# Patient Record
Sex: Female | Born: 1960 | Race: White | Hispanic: No | Marital: Married | Smoking: Former smoker
Health system: Southern US, Community
[De-identification: ages and names within clinical notes are randomized; demographics above are authoritative.]

## PROBLEM LIST (undated history)

## (undated) DIAGNOSIS — M949 Disorder of cartilage, unspecified: Secondary | ICD-10-CM

## (undated) DIAGNOSIS — E785 Hyperlipidemia, unspecified: Secondary | ICD-10-CM

## (undated) DIAGNOSIS — N946 Dysmenorrhea, unspecified: Secondary | ICD-10-CM

## (undated) DIAGNOSIS — Z780 Asymptomatic menopausal state: Secondary | ICD-10-CM

## (undated) DIAGNOSIS — J301 Allergic rhinitis due to pollen: Secondary | ICD-10-CM

## (undated) DIAGNOSIS — Z87891 Personal history of nicotine dependence: Secondary | ICD-10-CM

## (undated) DIAGNOSIS — M899 Disorder of bone, unspecified: Secondary | ICD-10-CM

## (undated) DIAGNOSIS — I10 Essential (primary) hypertension: Secondary | ICD-10-CM

## (undated) DIAGNOSIS — Z8249 Family history of ischemic heart disease and other diseases of the circulatory system: Secondary | ICD-10-CM

## (undated) DIAGNOSIS — N390 Urinary tract infection, site not specified: Secondary | ICD-10-CM

## (undated) HISTORY — DX: Essential (primary) hypertension: I10

## (undated) HISTORY — DX: Hyperlipidemia, unspecified: E78.5

## (undated) HISTORY — DX: Disorder of cartilage, unspecified: M94.9

## (undated) HISTORY — DX: Asymptomatic menopausal state: Z78.0

## (undated) HISTORY — DX: Family history of ischemic heart disease and other diseases of the circulatory system: Z82.49

## (undated) HISTORY — DX: Allergic rhinitis due to pollen: J30.1

## (undated) HISTORY — DX: Personal history of nicotine dependence: Z87.891

## (undated) HISTORY — DX: Disorder of bone, unspecified: M89.9

## (undated) HISTORY — PX: BREAST ENHANCEMENT SURGERY: SHX7

## (undated) HISTORY — DX: Dysmenorrhea, unspecified: N94.6

## (undated) HISTORY — DX: Urinary tract infection, site not specified: N39.0

---

## 2003-04-12 ENCOUNTER — Other Ambulatory Visit: Admission: RE | Admit: 2003-04-12 | Discharge: 2003-04-12 | Payer: Self-pay | Admitting: Obstetrics and Gynecology

## 2003-04-15 ENCOUNTER — Ambulatory Visit (HOSPITAL_COMMUNITY): Admission: RE | Admit: 2003-04-15 | Discharge: 2003-04-15 | Payer: Self-pay | Admitting: Obstetrics and Gynecology

## 2003-04-15 ENCOUNTER — Encounter: Payer: Self-pay | Admitting: Obstetrics and Gynecology

## 2004-05-07 ENCOUNTER — Ambulatory Visit (HOSPITAL_COMMUNITY): Admission: RE | Admit: 2004-05-07 | Discharge: 2004-05-07 | Payer: Self-pay | Admitting: Obstetrics and Gynecology

## 2005-01-07 ENCOUNTER — Encounter: Admission: RE | Admit: 2005-01-07 | Discharge: 2005-01-07 | Payer: Self-pay | Admitting: Endocrinology

## 2005-05-10 ENCOUNTER — Ambulatory Visit (HOSPITAL_COMMUNITY): Admission: RE | Admit: 2005-05-10 | Discharge: 2005-05-10 | Payer: Self-pay | Admitting: Obstetrics and Gynecology

## 2005-06-04 ENCOUNTER — Encounter: Admission: RE | Admit: 2005-06-04 | Discharge: 2005-06-04 | Payer: Self-pay | Admitting: Obstetrics and Gynecology

## 2006-06-05 ENCOUNTER — Ambulatory Visit (HOSPITAL_COMMUNITY): Admission: RE | Admit: 2006-06-05 | Discharge: 2006-06-05 | Payer: Self-pay | Admitting: Obstetrics and Gynecology

## 2006-11-08 IMAGING — US US SOFT TISSUE HEAD/NECK
1 series · 14 of 25 positions shown · non-contrast
Comparison: none

CLINICAL DATA: Neck swelling.
 THYROID ULTRASOUND:
 The right and left lobes of the thyroid are normal in size and contour with right lobe measuring 2.9 x 0.7 x 1.0 cm in size and the left lobe measuring 3.0 x 0.9 x 0.8 cm in size.  The gland is diffusely homogeneously without evidence for focal mass or cyst.

[Series 1: unknown · 0.07mm/px · 14 of 33 slices shown]
[im 1/33]
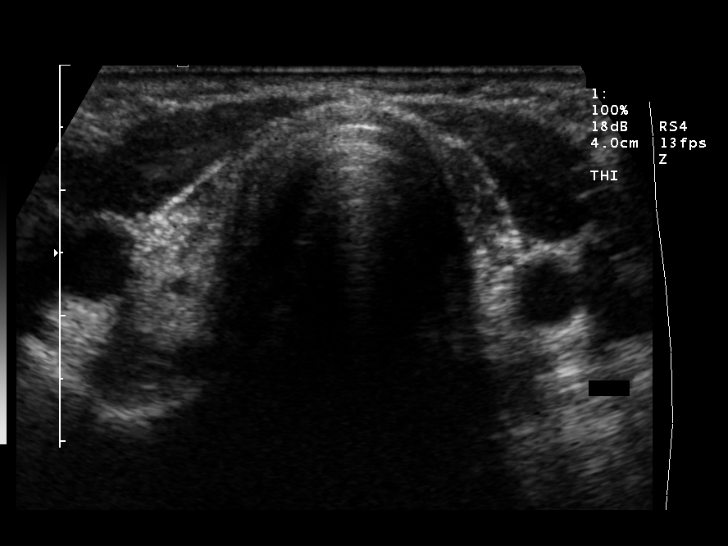
[im 3/33]
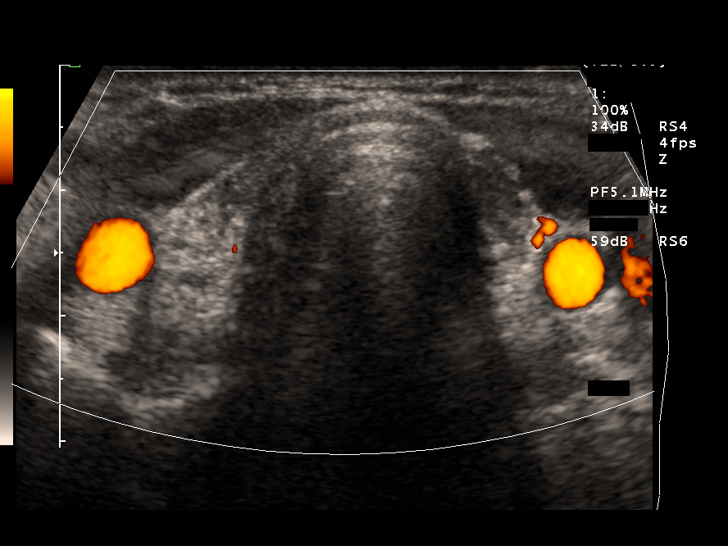
[im 6/33]
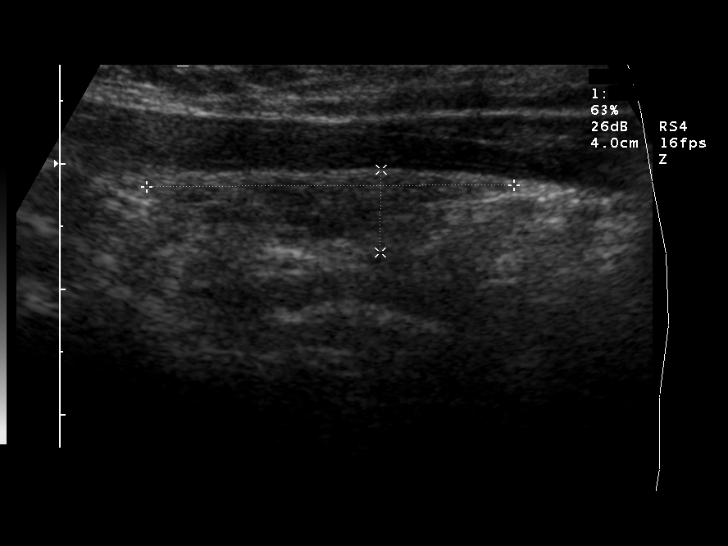
[im 9/33]
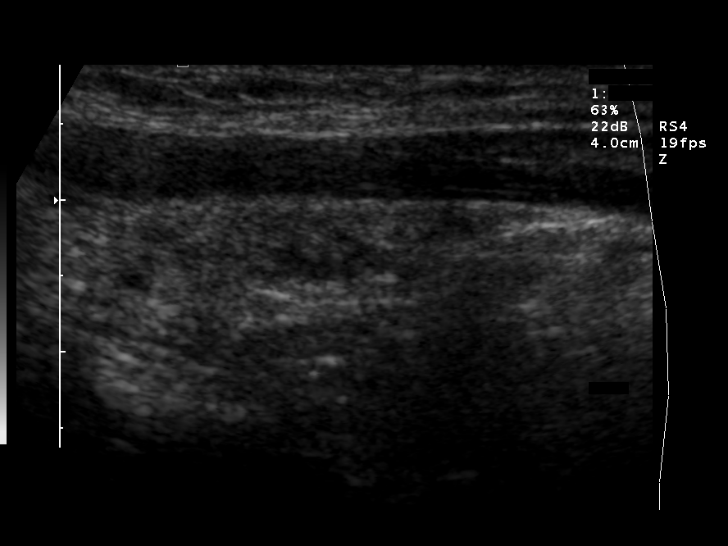
[im 11/33]
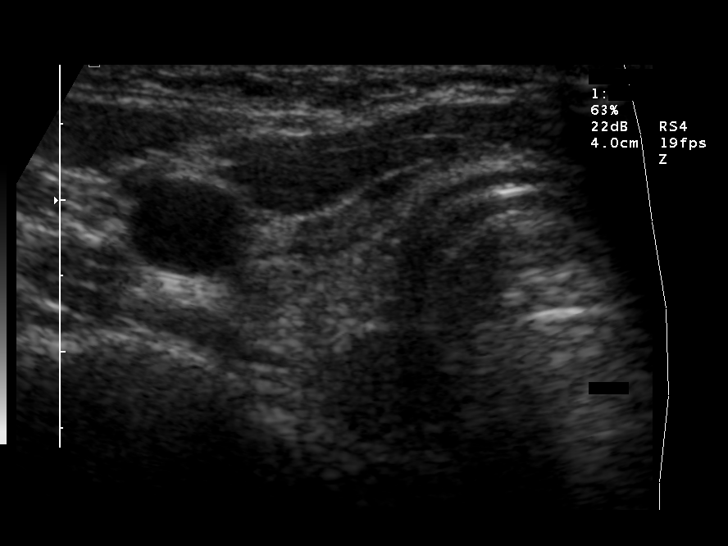
[im 13/33]
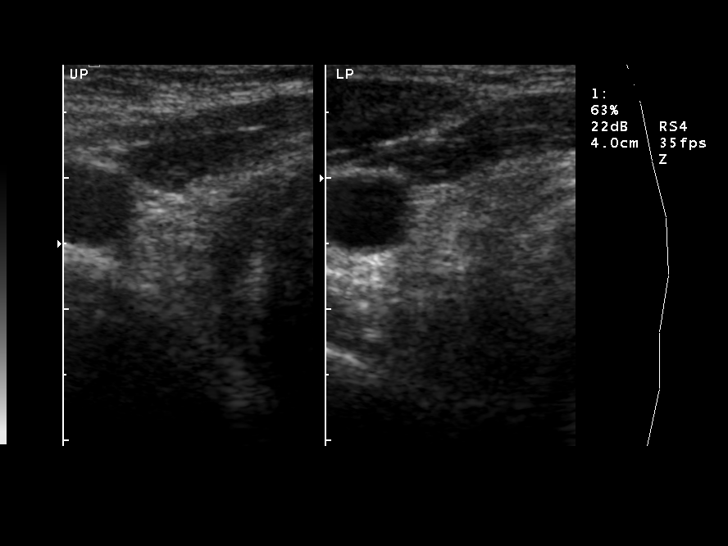
[im 15/33]
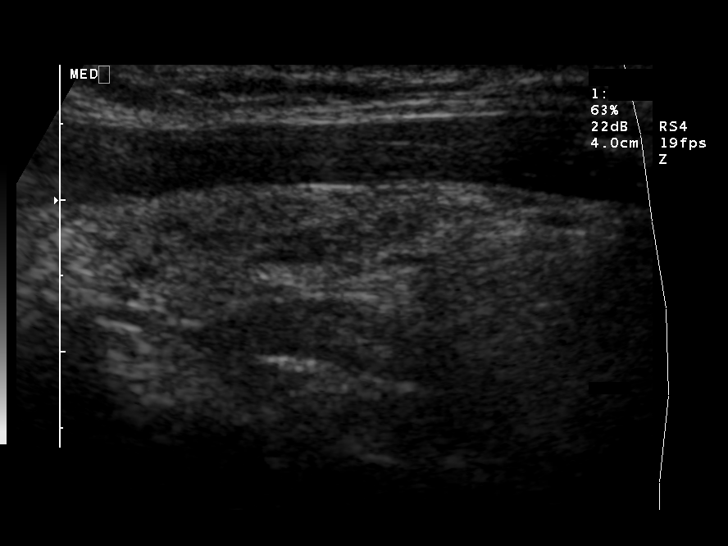
[im 18/33]
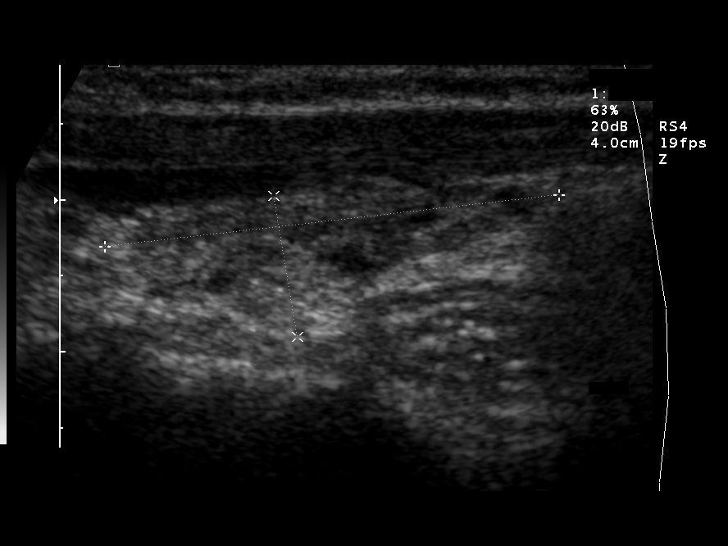
[im 21/33]
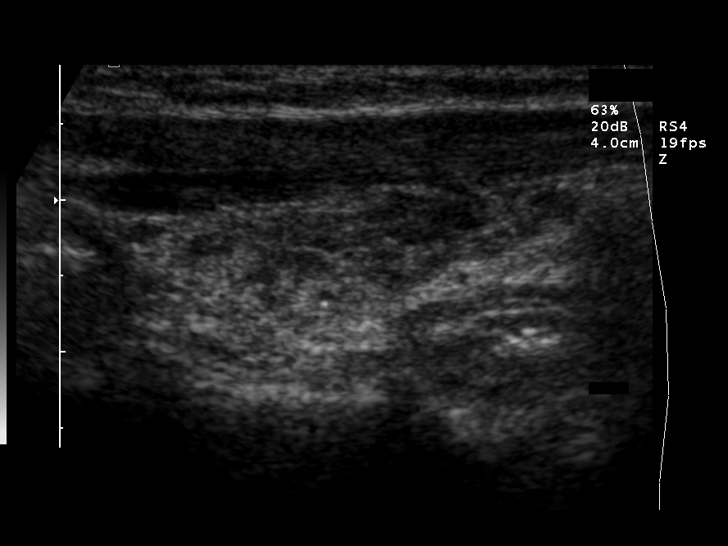
[im 22/33]
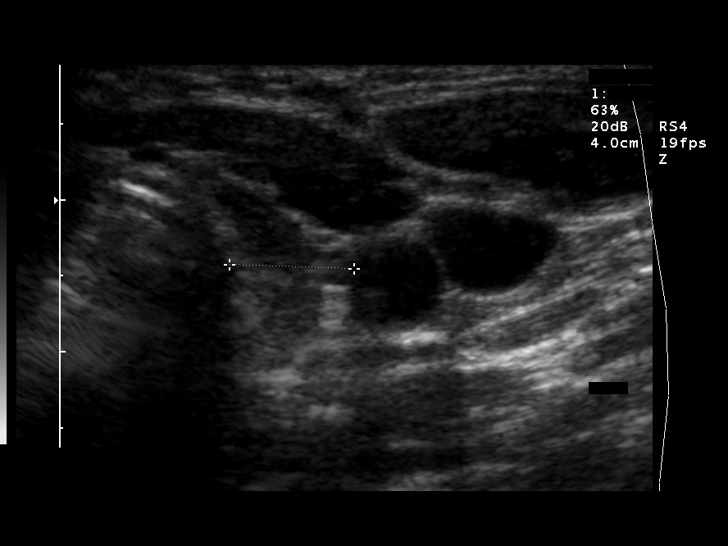
[im 25/33]
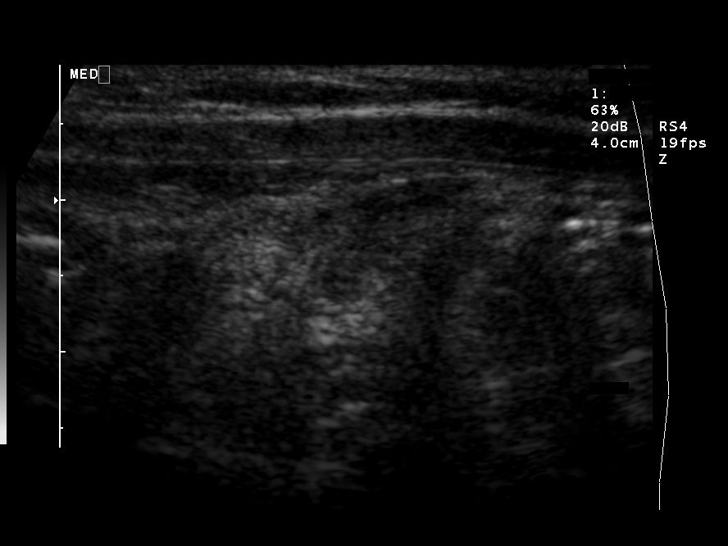
[im 27/33]
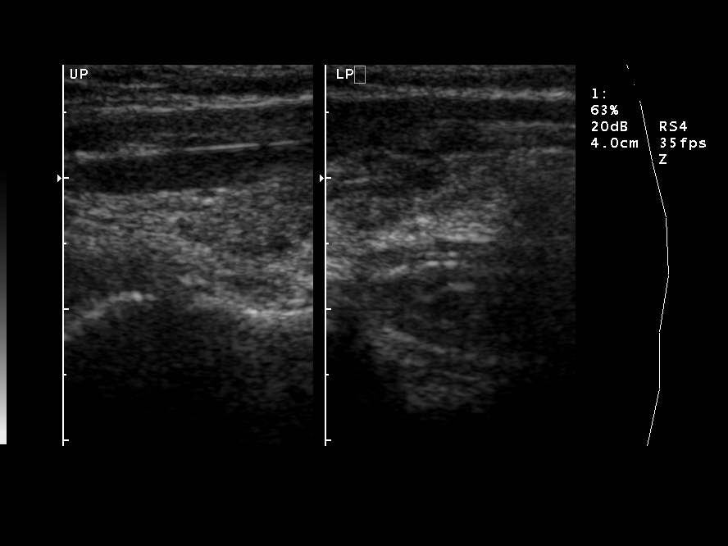
[im 30/33]
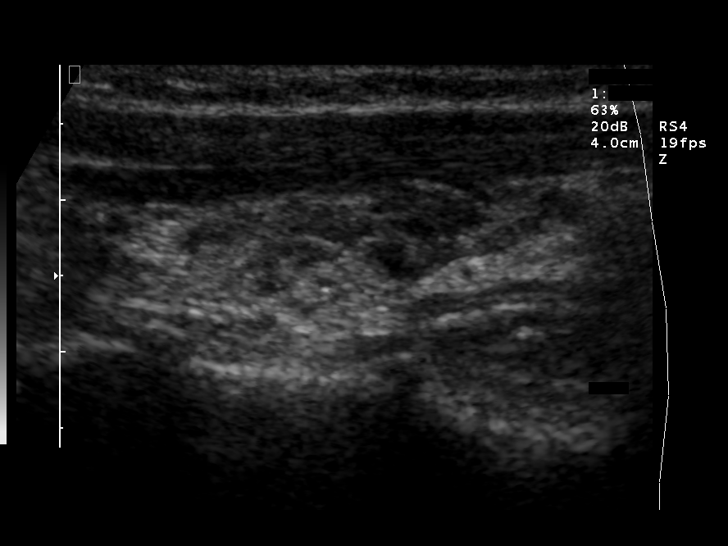
[im 33/33]
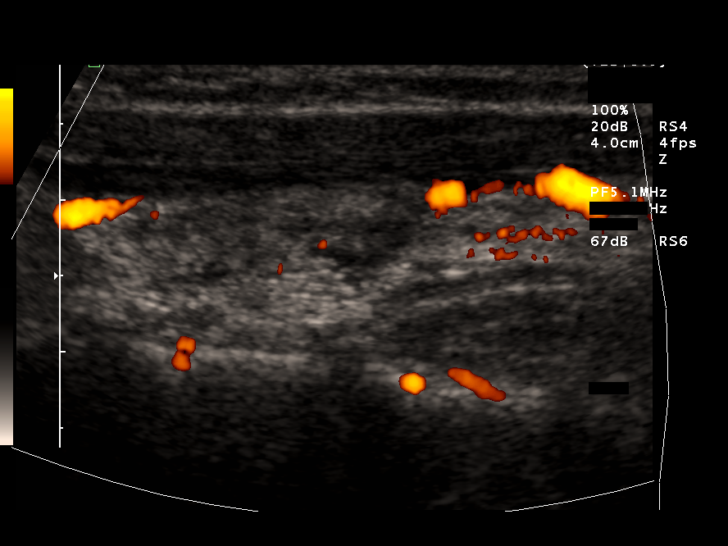

[14 of 25 positions shown; findings below may reference images not displayed]

IMPRESSION: Inhomogeneous gland without focal abnormality.

## 2007-06-17 ENCOUNTER — Ambulatory Visit (HOSPITAL_COMMUNITY): Admission: RE | Admit: 2007-06-17 | Discharge: 2007-06-17 | Payer: Self-pay | Admitting: Obstetrics and Gynecology

## 2008-06-29 ENCOUNTER — Ambulatory Visit (HOSPITAL_COMMUNITY): Admission: RE | Admit: 2008-06-29 | Discharge: 2008-06-29 | Payer: Self-pay | Admitting: Obstetrics and Gynecology

## 2009-07-05 ENCOUNTER — Ambulatory Visit (HOSPITAL_COMMUNITY): Admission: RE | Admit: 2009-07-05 | Discharge: 2009-07-05 | Payer: Self-pay | Admitting: Obstetrics and Gynecology

## 2010-07-14 ENCOUNTER — Encounter: Payer: Self-pay | Admitting: Obstetrics and Gynecology

## 2010-07-17 ENCOUNTER — Encounter
Admission: RE | Admit: 2010-07-17 | Discharge: 2010-07-17 | Payer: Self-pay | Source: Home / Self Care | Attending: Obstetrics and Gynecology | Admitting: Obstetrics and Gynecology

## 2011-07-17 ENCOUNTER — Other Ambulatory Visit: Payer: Self-pay | Admitting: Obstetrics and Gynecology

## 2011-07-17 DIAGNOSIS — Z1231 Encounter for screening mammogram for malignant neoplasm of breast: Secondary | ICD-10-CM

## 2011-08-06 ENCOUNTER — Ambulatory Visit
Admission: RE | Admit: 2011-08-06 | Discharge: 2011-08-06 | Disposition: A | Discharge status: Home / Self Care | Payer: BC Managed Care – PPO | Source: Ambulatory Visit | Attending: Obstetrics and Gynecology | Admitting: Obstetrics and Gynecology

## 2011-08-06 DIAGNOSIS — Z1231 Encounter for screening mammogram for malignant neoplasm of breast: Secondary | ICD-10-CM

## 2011-08-13 ENCOUNTER — Other Ambulatory Visit: Payer: Self-pay | Admitting: Obstetrics and Gynecology

## 2011-08-13 DIAGNOSIS — R928 Other abnormal and inconclusive findings on diagnostic imaging of breast: Secondary | ICD-10-CM

## 2011-08-21 ENCOUNTER — Ambulatory Visit
Admission: RE | Admit: 2011-08-21 | Discharge: 2011-08-21 | Disposition: A | Discharge status: Home / Self Care | Payer: BC Managed Care – PPO | Source: Ambulatory Visit | Attending: Obstetrics and Gynecology | Admitting: Obstetrics and Gynecology

## 2011-08-21 DIAGNOSIS — R928 Other abnormal and inconclusive findings on diagnostic imaging of breast: Secondary | ICD-10-CM

## 2012-08-04 ENCOUNTER — Other Ambulatory Visit: Payer: Self-pay | Admitting: Obstetrics and Gynecology

## 2012-08-04 DIAGNOSIS — Z1231 Encounter for screening mammogram for malignant neoplasm of breast: Secondary | ICD-10-CM

## 2012-09-04 ENCOUNTER — Ambulatory Visit
Admission: RE | Admit: 2012-09-04 | Discharge: 2012-09-04 | Disposition: A | Discharge status: Home / Self Care | Payer: BC Managed Care – PPO | Source: Ambulatory Visit | Attending: Obstetrics and Gynecology | Admitting: Obstetrics and Gynecology

## 2012-09-04 DIAGNOSIS — Z1231 Encounter for screening mammogram for malignant neoplasm of breast: Secondary | ICD-10-CM

## 2013-08-23 ENCOUNTER — Other Ambulatory Visit: Payer: Self-pay

## 2013-08-23 DIAGNOSIS — Z1231 Encounter for screening mammogram for malignant neoplasm of breast: Secondary | ICD-10-CM

## 2013-09-07 ENCOUNTER — Ambulatory Visit
Admission: RE | Admit: 2013-09-07 | Discharge: 2013-09-07 | Disposition: A | Discharge status: Home / Self Care | Payer: BC Managed Care – PPO | Source: Ambulatory Visit

## 2013-09-07 DIAGNOSIS — Z1231 Encounter for screening mammogram for malignant neoplasm of breast: Secondary | ICD-10-CM

## 2015-09-25 ENCOUNTER — Other Ambulatory Visit: Payer: Self-pay | Admitting: Certified Nurse Midwife

## 2015-09-25 DIAGNOSIS — M899 Disorder of bone, unspecified: Secondary | ICD-10-CM

## 2015-09-25 DIAGNOSIS — M949 Disorder of cartilage, unspecified: Principal | ICD-10-CM

## 2015-10-01 DIAGNOSIS — J069 Acute upper respiratory infection, unspecified: Secondary | ICD-10-CM | POA: Diagnosis not present

## 2015-10-12 ENCOUNTER — Ambulatory Visit
Admission: RE | Admit: 2015-10-12 | Discharge: 2015-10-12 | Disposition: A | Discharge status: Home / Self Care | Payer: BLUE CROSS/BLUE SHIELD | Source: Ambulatory Visit | Attending: Certified Nurse Midwife | Admitting: Certified Nurse Midwife

## 2015-10-12 DIAGNOSIS — Z780 Asymptomatic menopausal state: Secondary | ICD-10-CM | POA: Diagnosis not present

## 2015-10-12 DIAGNOSIS — M899 Disorder of bone, unspecified: Secondary | ICD-10-CM

## 2015-10-12 DIAGNOSIS — M810 Age-related osteoporosis without current pathological fracture: Secondary | ICD-10-CM | POA: Diagnosis not present

## 2015-10-12 DIAGNOSIS — M949 Disorder of cartilage, unspecified: Principal | ICD-10-CM

## 2015-11-14 ENCOUNTER — Encounter: Payer: Self-pay | Admitting: Cardiology

## 2015-11-17 ENCOUNTER — Ambulatory Visit (INDEPENDENT_AMBULATORY_CARE_PROVIDER_SITE_OTHER)
Admission: RE | Admit: 2015-11-17 | Discharge: 2015-11-17 | Disposition: A | Discharge status: Home / Self Care | Payer: Self-pay | Source: Ambulatory Visit | Attending: Cardiology | Admitting: Cardiology

## 2015-11-17 ENCOUNTER — Ambulatory Visit (INDEPENDENT_AMBULATORY_CARE_PROVIDER_SITE_OTHER): Payer: BLUE CROSS/BLUE SHIELD | Admitting: Cardiology

## 2015-11-17 ENCOUNTER — Encounter: Payer: Self-pay | Admitting: Cardiology

## 2015-11-17 VITALS — BP 116/84 | HR 69 | Ht 62.0 in | Wt 135.4 lb

## 2015-11-17 DIAGNOSIS — Z87891 Personal history of nicotine dependence: Secondary | ICD-10-CM | POA: Insufficient documentation

## 2015-11-17 DIAGNOSIS — I10 Essential (primary) hypertension: Secondary | ICD-10-CM | POA: Diagnosis not present

## 2015-11-17 DIAGNOSIS — Z780 Asymptomatic menopausal state: Secondary | ICD-10-CM | POA: Insufficient documentation

## 2015-11-17 DIAGNOSIS — Z8249 Family history of ischemic heart disease and other diseases of the circulatory system: Secondary | ICD-10-CM | POA: Diagnosis not present

## 2015-11-17 DIAGNOSIS — E785 Hyperlipidemia, unspecified: Secondary | ICD-10-CM | POA: Insufficient documentation

## 2015-11-17 NOTE — Patient Instructions (Addendum)
Medication Instructions:  Your physician recommends that you continue on your current medications as directed. Please refer to the Current Medication list given to you today.   Labwork: Your physician recommends that you return for FASTING lab work in: 4 MONTHS.   Testing/Procedures: Your physician has requested that you have an exercise tolerance test. For further information please visit [email or web address] Please also follow instruction sheet, as given.   Dr. Mayford Knife recommends you have a coronary calcium score.  Follow-Up: Your physician wants you to follow-up in: 1 year with Dr. Mayford Knife. You will receive a reminder letter in the mail two months in advance. If you don't receive a letter, please call our office to schedule the follow-up appointment.   Any Other Special Instructions Will Be Listed Below (If Applicable).     If you need a refill on your cardiac medications before your next appointment, please call your pharmacy.  Fat and Cholesterol Restricted Diet High levels of fat and cholesterol in your blood may lead to various health problems, such as diseases of the heart, blood vessels, gallbladder, liver, and pancreas. Fats are concentrated sources of energy that come in various forms. Certain types of fat, including saturated fat, may be harmful in excess. Cholesterol is a substance needed by your body in small amounts. Your body makes all the cholesterol it needs. Excess cholesterol comes from the food you eat. When you have high levels of cholesterol and saturated fat in your blood, health problems can develop because the excess fat and cholesterol will gather along the walls of your blood vessels, causing them to narrow. Choosing the right foods will help you control your intake of fat and cholesterol. This will help keep the levels of these substances in your blood within normal limits and reduce your risk of disease.  WHAT TYPES OF FAT SHOULD I CHOOSE?  Choose healthy fats  more often. Choose monounsaturated and polyunsaturated fats, such as olive and canola oil, flaxseeds, walnuts, almonds, and seeds.  Eat more omega-3 fats. Good choices include salmon, mackerel, sardines, tuna, flaxseed oil, and ground flaxseeds. Aim to eat fish at least two times a week.  Limit saturated fats. Saturated fats are primarily found in animal products, such as meats, butter, and cream. Plant sources of saturated fats include palm oil, palm kernel oil, and coconut oil.  Avoid foods with partially hydrogenated oils in them. These contain trans fats. Examples of foods that contain trans fats are stick margarine, some tub margarines, cookies, crackers, and other baked goods. WHAT GENERAL GUIDELINES DO I NEED TO FOLLOW? These guidelines for healthy eating will help you control your intake of fat and cholesterol:  Check food labels carefully to identify foods with trans fats or high amounts of saturated fat.  Fill one half of your plate with vegetables and green salads.  Fill one fourth of your plate with whole grains. Look for the word "whole" as the first word in the ingredient list.  Fill one fourth of your plate with lean protein foods.  Limit fruit to two servings a day. Choose fruit instead of juice.  Eat more foods that contain soluble fiber. Examples of foods that contain this type of fiber are apples, broccoli, carrots, beans, peas, and barley. Aim to get 20-30 g of fiber per day.  Eat more home-cooked food and less restaurant, buffet, and fast food.  Limit or avoid alcohol.  Limit foods high in starch and sugar.  Limit fried foods.  Cook foods using methods other than  frying. Baking, boiling, grilling, and broiling are all great options.  Lose weight if you are overweight. Losing just 5-10% of your initial body weight can help your overall health and prevent diseases such as diabetes and heart disease. WHAT FOODS CAN I EAT? Grains Whole grains, such as whole wheat  or whole grain breads, crackers, cereals, and pasta. Unsweetened oatmeal, bulgur, barley, quinoa, or brown rice. Corn or whole wheat flour tortillas. Vegetables Fresh or frozen vegetables (raw, steamed, roasted, or grilled). Green salads. Fruits All fresh, canned (in natural juice), or frozen fruits. Meat and Other Protein Products Ground beef (85% or leaner), grass-fed beef, or beef trimmed of fat. Skinless chicken or [place]. Ground chicken or [place]. Pork trimmed of fat. All fish and seafood. Eggs. Dried beans, peas, or lentils. Unsalted nuts or seeds. Unsalted canned or dry beans. Dairy Low-fat dairy products, such as skim or 1% milk, 2% or reduced-fat cheeses, low-fat ricotta or cottage cheese, or plain low-fat yogurt. Fats and Oils Tub margarines without trans fats. Light or reduced-fat mayonnaise and salad dressings. Avocado. Olive, canola, sesame, or safflower oils. Natural peanut or almond butter (choose ones without added sugar and oil). The items listed above may not be a complete list of recommended foods or beverages. Contact your dietitian for more options. WHAT FOODS ARE NOT RECOMMENDED? Grains White bread. White pasta. White rice. Cornbread. Bagels, pastries, and croissants. Crackers that contain trans fat. Vegetables White potatoes. Corn. Creamed or fried vegetables. Vegetables in a cheese sauce. Fruits Dried fruits. Canned fruit in light or heavy syrup. Fruit juice. Meat and Other Protein Products Fatty cuts of meat. Ribs, chicken wings, bacon, sausage, bologna, salami, chitterlings, fatback, hot dogs, bratwurst, and packaged luncheon meats. Liver and organ meats. Dairy Whole or 2% milk, cream, half-and-half, and cream cheese. Whole milk cheeses. Whole-fat or sweetened yogurt. Full-fat cheeses. Nondairy creamers and whipped toppings. Processed cheese, cheese spreads, or cheese curds. Sweets and Desserts Corn syrup, sugars, honey, and molasses. Candy. Jam and jelly. Syrup.  Sweetened cereals. Cookies, pies, cakes, donuts, muffins, and ice cream. Fats and Oils Butter, stick margarine, lard, shortening, ghee, or bacon fat. Coconut, palm kernel, or palm oils. Beverages Alcohol. Sweetened drinks (such as sodas, lemonade, and fruit drinks or punches). The items listed above may not be a complete list of foods and beverages to avoid. Contact your dietitian for more information.   This information is not intended to replace advice given to you by your health care provider. Make sure you discuss any questions you have with your health care provider.   Document Released: 06/10/2005 Document Revised: 07/01/2014 Document Reviewed: 09/08/2013 Elsevier Interactive Patient Education [employer]2016 Elsevier Inc.

## 2015-11-17 NOTE — Progress Notes (Signed)
Cardiology Office Note    Date:  11/17/2015   ID:  Charlotte Peters, DOB February 10, 1961, MRN [id number]  PCP:  Ethel Rana  Cardiologist:  Armanda Magic, MD   Chief Complaint  Patient presents with  . Hyperlipidemia    History of Present Illness:  Charlotte Peters is a 55 y.o. female with a history of dyslipidemia, hypothyroidism and HTN.  She was seen recently by her GYN for evaluation of hyperlipidemia.  She is post menopausal.  She is a former smoker.  She has a family history of heart disease in her mother, father and grandfather.  Her most recent TSH was normal at 2.8.  HbA1C was 5.7%.  Her lipids showed a total cholesterol of 268, triglycerides 248, HDL 67 and LDL 151.  She denies any chest pain, SOB, DOE, palpitations, dizziness, LE edema or syncope.      Past Medical History  Diagnosis Date  . Menopause   . Urinary tract infection   . Dysmenorrhea   . Disorder of bone and cartilage   . Allergic rhinitis due to pollen   . Hyperlipidemia   . Hypertension, benign   . Hyperlipidemia with target LDL less than 100   . Post-menopause   . Past use of tobacco   . Family history of early CAD     Momhad CABG in mid 27's    Past Surgical History  Procedure Laterality Date  . Breast enhancement surgery      Current Medications: Outpatient Prescriptions Prior to Visit  Medication Sig Dispense Refill  . cetirizine (ZYRTEC) 10 MG tablet Take 10 mg by mouth daily as needed for allergies.    . Cholecalciferol (VITAMIN D) 2000 units CAPS Take 1 each by mouth daily.    . Cyanocobalamin (VITAMIN B-12 CR PO) Take 1 tablet by mouth daily.    Marland Kitchen Fexofenadine-Pseudoephedrine (ALLEGRA-D ALLERGY & CONGESTION PO) Take 1 tablet by mouth as needed.    . fluticasone (FLONASE) 50 MCG/ACT nasal spray Place 1 spray into both nostrils daily.    . folic acid (FOLVITE) 1 MG tablet Take 1 mg by mouth daily.    Marland Kitchen levothyroxine (SYNTHROID, LEVOTHROID) 75 MCG tablet Take 75 mcg by mouth daily before  breakfast.    . MAGNESIUM PO Take 1 tablet by mouth daily.     No facility-administered medications prior to visit.     Allergies:   Review of patient's allergies indicates no known allergies.   Social History   Social History  . Marital Status: Married    Spouse Name: N/A  . Number of Children: N/A  . Years of Education: N/A   Social History Main Topics  . Smoking status: Former Games developer  . Smokeless tobacco: None  . Alcohol Use: 0.0 oz/week    0 Standard drinks or equivalent per week  . Drug Use: None  . Sexual Activity: Not Asked   Other Topics Concern  . None   Social History Narrative     Family History:  The patient's family history includes CAD in her maternal grandfather; CAD (age of onset: 62) in her mother; Colon cancer in her maternal aunt; Diabetes Mellitus I in her father and mother; Heart disease in her father and mother; Hypertension in her sister.   ROS:   Please see the history of present illness.    ROS All other systems reviewed and are negative.   PHYSICAL EXAM:   VS:  BP 116/84 mmHg  Pulse 69  Ht 5\' 2"  (  1.575 m)  Wt 135 lb 6.4 oz (61.417 kg)  BMI 24.76 kg/m2   GEN: Well nourished, well developed, in no acute distress HEENT: normal Neck: no JVD, carotid bruits, or masses Cardiac: RRR; no murmurs, rubs, or gallops,no edema.  Intact distal pulses bilaterally.  Respiratory:  clear to auscultation bilaterally, normal work of breathing GI: soft, nontender, nondistended, + BS MS: no deformity or atrophy Skin: warm and dry, no rash Neuro:  Alert and Oriented x 3, Strength and sensation are intact Psych: euthymic mood, full affect  Wt Readings from Last 3 Encounters:  11/17/15 135 lb 6.4 oz (61.417 kg)      Studies/Labs Reviewed:   EKG:  EKG is  ordered today.  The ekg ordered today demonstrates NSR at 69bpm with no ST changes  Recent Labs: No results found for requested labs within last 365 days.   Lipid Panel No results found for: CHOL,  TRIG, HDL, CHOLHDL, VLDL, LDLCALC, LDLDIRECT  Additional studies/ records that were reviewed today include:  OV notes from GYN and labs    ASSESSMENT:    1. Hyperlipidemia with target LDL less than 100   2. Benign essential HTN   3. Family history of early CAD      PLAN:  In order of problems listed above:  1. Hyperlipidemia - her LDL is elevated along with her TAGs.  Her HbA1C is normal as well as TSH.  She has several CRFS including HTN, dyslipidemia, remote history of tobacco use and post menopausal state.  She also has a family history of CAD.  I would like to see her LDL < 100 given her risk factors.  She does not want to start a lipid lowering agent at this time. She would like to try to diet and exercise first.  I will recheck an FLP and ALT in 4 weeks.  2. HTN - Bp diet controlled 3. Family history of CAD (momhad CABG in mid 10's)-  I have recommended a baseline ETT and chest CT for calcium score to further risk stratify.   She will followup with me in 1 year    Medication Adjustments/Labs and Tests Ordered: Current medicines are reviewed at length with the patient today.  Concerns regarding medicines are outlined above.  Medication changes, Labs and Tests ordered today are listed in the Patient Instructions below.  Patient Instructions  Medication Instructions:  Your physician recommends that you continue on your current medications as directed. Please refer to the Current Medication list given to you today.   Labwork: Your physician recommends that you return for FASTING lab work in: 4 MONTHS.   Testing/Procedures: Your physician has requested that you have an exercise tolerance test. For further information please visit [email or web address] Please also follow instruction sheet, as given.   Dr. Mayford Knife recommends you have a coronary calcium score.  Follow-Up: Your physician wants you to follow-up in: 1 year with Dr. Mayford Knife. You will receive a reminder letter in the  mail two months in advance. If you don't receive a letter, please call our office to schedule the follow-up appointment.   Any Other Special Instructions Will Be Listed Below (If Applicable).     If you need a refill on your cardiac medications before your next appointment, please call your pharmacy.  Fat and Cholesterol Restricted Diet High levels of fat and cholesterol in your blood may lead to various health problems, such as diseases of the heart, blood vessels, gallbladder, liver, and pancreas. Fats are concentrated sources  of energy that come in various forms. Certain types of fat, including saturated fat, may be harmful in excess. Cholesterol is a substance needed by your body in small amounts. Your body makes all the cholesterol it needs. Excess cholesterol comes from the food you eat. When you have high levels of cholesterol and saturated fat in your blood, health problems can develop because the excess fat and cholesterol will gather along the walls of your blood vessels, causing them to narrow. Choosing the right foods will help you control your intake of fat and cholesterol. This will help keep the levels of these substances in your blood within normal limits and reduce your risk of disease.  WHAT TYPES OF FAT SHOULD I CHOOSE?  Choose healthy fats more often. Choose monounsaturated and polyunsaturated fats, such as olive and canola oil, flaxseeds, walnuts, almonds, and seeds.  Eat more omega-3 fats. Good choices include salmon, mackerel, sardines, tuna, flaxseed oil, and ground flaxseeds. Aim to eat fish at least two times a week.  Limit saturated fats. Saturated fats are primarily found in animal products, such as meats, butter, and cream. Plant sources of saturated fats include palm oil, palm kernel oil, and coconut oil.  Avoid foods with partially hydrogenated oils in them. These contain trans fats. Examples of foods that contain trans fats are stick margarine, some tub margarines,  cookies, crackers, and other baked goods. WHAT GENERAL GUIDELINES DO I NEED TO FOLLOW? These guidelines for healthy eating will help you control your intake of fat and cholesterol:  Check food labels carefully to identify foods with trans fats or high amounts of saturated fat.  Fill one half of your plate with vegetables and green salads.  Fill one fourth of your plate with whole grains. Look for the word "whole" as the first word in the ingredient list.  Fill one fourth of your plate with lean protein foods.  Limit fruit to two servings a day. Choose fruit instead of juice.  Eat more foods that contain soluble fiber. Examples of foods that contain this type of fiber are apples, broccoli, carrots, beans, peas, and barley. Aim to get 20-30 g of fiber per day.  Eat more home-cooked food and less restaurant, buffet, and fast food.  Limit or avoid alcohol.  Limit foods high in starch and sugar.  Limit fried foods.  Cook foods using methods other than frying. Baking, boiling, grilling, and broiling are all great options.  Lose weight if you are overweight. Losing just 5-10% of your initial body weight can help your overall health and prevent diseases such as diabetes and heart disease. WHAT FOODS CAN I EAT? Grains Whole grains, such as whole wheat or whole grain breads, crackers, cereals, and pasta. Unsweetened oatmeal, bulgur, barley, quinoa, or brown rice. Corn or whole wheat flour tortillas. Vegetables Fresh or frozen vegetables (raw, steamed, roasted, or grilled). Green salads. Fruits All fresh, canned (in natural juice), or frozen fruits. Meat and Other Protein Products Ground beef (85% or leaner), grass-fed beef, or beef trimmed of fat. Skinless chicken or [place]. Ground chicken or [place]. Pork trimmed of fat. All fish and seafood. Eggs. Dried beans, peas, or lentils. Unsalted nuts or seeds. Unsalted canned or dry beans. Dairy Low-fat dairy products, such as skim or 1% milk, 2%  or reduced-fat cheeses, low-fat ricotta or cottage cheese, or plain low-fat yogurt. Fats and Oils Tub margarines without trans fats. Light or reduced-fat mayonnaise and salad dressings. Avocado. Olive, canola, sesame, or safflower oils. Natural peanut or  almond butter (choose ones without added sugar and oil). The items listed above may not be a complete list of recommended foods or beverages. Contact your dietitian for more options. WHAT FOODS ARE NOT RECOMMENDED? Grains White bread. White pasta. White rice. Cornbread. Bagels, pastries, and croissants. Crackers that contain trans fat. Vegetables White potatoes. Corn. Creamed or fried vegetables. Vegetables in a cheese sauce. Fruits Dried fruits. Canned fruit in light or heavy syrup. Fruit juice. Meat and Other Protein Products Fatty cuts of meat. Ribs, chicken wings, bacon, sausage, bologna, salami, chitterlings, fatback, hot dogs, bratwurst, and packaged luncheon meats. Liver and organ meats. Dairy Whole or 2% milk, cream, half-and-half, and cream cheese. Whole milk cheeses. Whole-fat or sweetened yogurt. Full-fat cheeses. Nondairy creamers and whipped toppings. Processed cheese, cheese spreads, or cheese curds. Sweets and Desserts Corn syrup, sugars, honey, and molasses. Candy. Jam and jelly. Syrup. Sweetened cereals. Cookies, pies, cakes, donuts, muffins, and ice cream. Fats and Oils Butter, stick margarine, lard, shortening, ghee, or bacon fat. Coconut, palm kernel, or palm oils. Beverages Alcohol. Sweetened drinks (such as sodas, lemonade, and fruit drinks or punches). The items listed above may not be a complete list of foods and beverages to avoid. Contact your dietitian for more information.   This information is not intended to replace advice given to you by your health care provider. Make sure you discuss any questions you have with your health care provider.   Document Released: 06/10/2005 Document Revised: 07/01/2014 Document  Reviewed: 09/08/2013 Elsevier Interactive Patient Education [employer] Else[employer] Inc.      Signed, Armanda Magicraci Gursimran Litaker, MD  11/17/2015 12:04 PM    [facility] [address], [place], [place]  [address] Phone: [phone]; Fax: [phone]

## 2015-11-29 ENCOUNTER — Ambulatory Visit (INDEPENDENT_AMBULATORY_CARE_PROVIDER_SITE_OTHER): Payer: BLUE CROSS/BLUE SHIELD

## 2015-11-29 DIAGNOSIS — E785 Hyperlipidemia, unspecified: Secondary | ICD-10-CM | POA: Diagnosis not present

## 2015-11-29 DIAGNOSIS — Z8249 Family history of ischemic heart disease and other diseases of the circulatory system: Secondary | ICD-10-CM | POA: Diagnosis not present

## 2015-11-29 DIAGNOSIS — I10 Essential (primary) hypertension: Secondary | ICD-10-CM

## 2015-11-30 LAB — EXERCISE TOLERANCE TEST
CHL CUP MPHR: 166 {beats}/min
CHL CUP STRESS STAGE 1 DBP: 86 mmHg
CHL CUP STRESS STAGE 1 GRADE: 0 %
CHL CUP STRESS STAGE 1 HR: 91 {beats}/min
CHL CUP STRESS STAGE 1 SPEED: 0 mph
CHL CUP STRESS STAGE 2 HR: 91 {beats}/min
CHL CUP STRESS STAGE 2 SPEED: 1 mph
CHL CUP STRESS STAGE 3 SPEED: 1 mph
CHL CUP STRESS STAGE 4 DBP: 80 mmHg
CHL CUP STRESS STAGE 4 GRADE: 10 %
CHL CUP STRESS STAGE 4 SPEED: 1.7 mph
CHL CUP STRESS STAGE 5 GRADE: 12 %
CHL CUP STRESS STAGE 5 HR: 148 {beats}/min
CHL CUP STRESS STAGE 5 SBP: 155 mmHg
CHL CUP STRESS STAGE 5 SPEED: 2.5 mph
CHL CUP STRESS STAGE 6 DBP: 79 mmHg
CHL CUP STRESS STAGE 6 GRADE: 14 %
CHL CUP STRESS STAGE 6 SBP: 154 mmHg
CHL CUP STRESS STAGE 6 SPEED: 3.4 mph
CHL CUP STRESS STAGE 7 GRADE: 14.2 %
CHL CUP STRESS STAGE 7 HR: 166 {beats}/min
CHL CUP STRESS STAGE 8 SBP: 145 mmHg
CHL CUP STRESS STAGE 9 HR: 90 {beats}/min
Estimated workload: 10.1 METS
Exercise duration (min): 9 min
Exercise duration (sec): 0 s
Peak HR: 166 {beats}/min
Percent HR: 100 %
Percent of predicted max HR: 100 %
RPE: 15
Rest HR: 85 {beats}/min
Stage 1 SBP: 133 mmHg
Stage 2 Grade: 0 %
Stage 3 Grade: 0.1 %
Stage 3 HR: 92 {beats}/min
Stage 4 HR: 118 {beats}/min
Stage 4 SBP: 146 mmHg
Stage 5 DBP: 78 mmHg
Stage 6 HR: 166 {beats}/min
Stage 7 Speed: 3.4 mph
Stage 8 DBP: 75 mmHg
Stage 8 Grade: 0 %
Stage 8 HR: 136 {beats}/min
Stage 8 Speed: 0 mph
Stage 9 DBP: 78 mmHg
Stage 9 Grade: 0 %
Stage 9 SBP: 132 mmHg
Stage 9 Speed: 0 mph

## 2016-03-05 ENCOUNTER — Other Ambulatory Visit (INDEPENDENT_AMBULATORY_CARE_PROVIDER_SITE_OTHER): Payer: BLUE CROSS/BLUE SHIELD

## 2016-03-05 DIAGNOSIS — E785 Hyperlipidemia, unspecified: Secondary | ICD-10-CM | POA: Diagnosis not present

## 2016-03-05 LAB — HEPATIC FUNCTION PANEL
ALBUMIN: 4.7 g/dL (ref 3.6–5.1)
ALK PHOS: 60 U/L (ref 33–130)
ALT: 12 U/L (ref 6–29)
AST: 19 U/L (ref 10–35)
Bilirubin, Direct: 0.2 mg/dL (ref <=0.2)
Indirect Bilirubin: 0.8 mg/dL (ref 0.2–1.2)
TOTAL PROTEIN: 7 g/dL (ref 6.1–8.1)
Total Bilirubin: 1 mg/dL (ref 0.2–1.2)

## 2016-03-05 LAB — LIPID PANEL
CHOL/HDL RATIO: 2.7 ratio (ref <=5.0)
Cholesterol: 208 mg/dL — ABNORMAL HIGH (ref 125–200)
HDL: 76 mg/dL (ref >=46)
LDL Cholesterol: 112 mg/dL (ref <130)
Triglycerides: 101 mg/dL (ref <150)
VLDL: 20 mg/dL (ref <30)

## 2016-03-07 ENCOUNTER — Telehealth: Payer: Self-pay

## 2016-03-07 DIAGNOSIS — E785 Hyperlipidemia, unspecified: Secondary | ICD-10-CM

## 2016-03-07 NOTE — Telephone Encounter (Signed)
Informed patient of results and verbal understanding expressed.  Recall placed for repeat fasting blood work in 4 months. Patient understands to work on low fat diet. She was grateful for call.

## 2016-03-07 NOTE — Telephone Encounter (Signed)
-----   Message from Quintella Reichertraci R Turner, MD sent at 03/05/2016  6:27 PM EDT ----- LDL close to 100.  Patient needs to work a little harder on low fat diet and repeat FLp in 4 months

## 2016-04-16 DIAGNOSIS — H6093 Unspecified otitis externa, bilateral: Secondary | ICD-10-CM | POA: Diagnosis not present

## 2016-04-25 DIAGNOSIS — F419 Anxiety disorder, unspecified: Secondary | ICD-10-CM | POA: Diagnosis not present

## 2016-04-25 DIAGNOSIS — J301 Allergic rhinitis due to pollen: Secondary | ICD-10-CM | POA: Diagnosis not present

## 2016-04-25 DIAGNOSIS — E039 Hypothyroidism, unspecified: Secondary | ICD-10-CM | POA: Diagnosis not present

## 2016-07-22 DIAGNOSIS — E039 Hypothyroidism, unspecified: Secondary | ICD-10-CM | POA: Diagnosis not present

## 2016-07-22 DIAGNOSIS — F419 Anxiety disorder, unspecified: Secondary | ICD-10-CM | POA: Diagnosis not present

## 2016-07-22 DIAGNOSIS — H6093 Unspecified otitis externa, bilateral: Secondary | ICD-10-CM | POA: Diagnosis not present

## 2016-09-30 DIAGNOSIS — Z1231 Encounter for screening mammogram for malignant neoplasm of breast: Secondary | ICD-10-CM | POA: Diagnosis not present

## 2016-09-30 DIAGNOSIS — Z01419 Encounter for gynecological examination (general) (routine) without abnormal findings: Secondary | ICD-10-CM | POA: Diagnosis not present

## 2016-09-30 DIAGNOSIS — Z6825 Body mass index (BMI) 25.0-25.9, adult: Secondary | ICD-10-CM | POA: Diagnosis not present

## 2016-10-17 DIAGNOSIS — E039 Hypothyroidism, unspecified: Secondary | ICD-10-CM | POA: Diagnosis not present

## 2016-10-17 DIAGNOSIS — H6093 Unspecified otitis externa, bilateral: Secondary | ICD-10-CM | POA: Diagnosis not present

## 2016-10-17 DIAGNOSIS — F419 Anxiety disorder, unspecified: Secondary | ICD-10-CM | POA: Diagnosis not present

## 2016-11-13 DIAGNOSIS — R0981 Nasal congestion: Secondary | ICD-10-CM | POA: Diagnosis not present

## 2016-11-13 DIAGNOSIS — H608X3 Other otitis externa, bilateral: Secondary | ICD-10-CM | POA: Diagnosis not present

## 2017-03-06 DIAGNOSIS — H60501 Unspecified acute noninfective otitis externa, right ear: Secondary | ICD-10-CM | POA: Diagnosis not present

## 2017-03-06 DIAGNOSIS — N390 Urinary tract infection, site not specified: Secondary | ICD-10-CM | POA: Diagnosis not present

## 2017-04-18 DIAGNOSIS — F419 Anxiety disorder, unspecified: Secondary | ICD-10-CM | POA: Diagnosis not present

## 2017-04-18 DIAGNOSIS — E039 Hypothyroidism, unspecified: Secondary | ICD-10-CM | POA: Diagnosis not present

## 2017-06-18 DIAGNOSIS — E039 Hypothyroidism, unspecified: Secondary | ICD-10-CM | POA: Diagnosis not present

## 2017-09-30 DIAGNOSIS — Z0000 Encounter for general adult medical examination without abnormal findings: Secondary | ICD-10-CM | POA: Diagnosis not present

## 2017-09-30 DIAGNOSIS — E559 Vitamin D deficiency, unspecified: Secondary | ICD-10-CM | POA: Diagnosis not present

## 2017-09-30 DIAGNOSIS — Z13220 Encounter for screening for lipoid disorders: Secondary | ICD-10-CM | POA: Diagnosis not present

## 2017-09-30 DIAGNOSIS — E039 Hypothyroidism, unspecified: Secondary | ICD-10-CM | POA: Diagnosis not present

## 2017-09-30 DIAGNOSIS — Z79899 Other long term (current) drug therapy: Secondary | ICD-10-CM | POA: Diagnosis not present

## 2017-09-30 DIAGNOSIS — Z131 Encounter for screening for diabetes mellitus: Secondary | ICD-10-CM | POA: Diagnosis not present

## 2017-10-03 DIAGNOSIS — Z1231 Encounter for screening mammogram for malignant neoplasm of breast: Secondary | ICD-10-CM | POA: Diagnosis not present

## 2018-06-02 DIAGNOSIS — E039 Hypothyroidism, unspecified: Secondary | ICD-10-CM | POA: Diagnosis not present

## 2018-07-21 DIAGNOSIS — Z1211 Encounter for screening for malignant neoplasm of colon: Secondary | ICD-10-CM | POA: Diagnosis not present

## 2018-07-21 DIAGNOSIS — Z800 Family history of malignant neoplasm of digestive organs: Secondary | ICD-10-CM | POA: Diagnosis not present

## 2018-07-21 DIAGNOSIS — Z8379 Family history of other diseases of the digestive system: Secondary | ICD-10-CM | POA: Diagnosis not present

## 2018-10-25 DIAGNOSIS — N390 Urinary tract infection, site not specified: Secondary | ICD-10-CM | POA: Diagnosis not present

## 2018-11-27 DIAGNOSIS — E039 Hypothyroidism, unspecified: Secondary | ICD-10-CM | POA: Diagnosis not present

## 2018-11-27 DIAGNOSIS — Z0000 Encounter for general adult medical examination without abnormal findings: Secondary | ICD-10-CM | POA: Diagnosis not present

## 2018-11-27 DIAGNOSIS — R300 Dysuria: Secondary | ICD-10-CM | POA: Diagnosis not present

## 2018-11-27 DIAGNOSIS — Z13220 Encounter for screening for lipoid disorders: Secondary | ICD-10-CM | POA: Diagnosis not present

## 2019-02-22 DIAGNOSIS — Z01419 Encounter for gynecological examination (general) (routine) without abnormal findings: Secondary | ICD-10-CM | POA: Diagnosis not present

## 2019-02-22 DIAGNOSIS — Z6826 Body mass index (BMI) 26.0-26.9, adult: Secondary | ICD-10-CM | POA: Diagnosis not present

## 2019-02-22 DIAGNOSIS — Z1231 Encounter for screening mammogram for malignant neoplasm of breast: Secondary | ICD-10-CM | POA: Diagnosis not present

## 2019-02-22 DIAGNOSIS — Z1151 Encounter for screening for human papillomavirus (HPV): Secondary | ICD-10-CM | POA: Diagnosis not present

## 2019-03-24 DIAGNOSIS — K635 Polyp of colon: Secondary | ICD-10-CM | POA: Diagnosis not present

## 2019-03-24 DIAGNOSIS — D125 Benign neoplasm of sigmoid colon: Secondary | ICD-10-CM | POA: Diagnosis not present

## 2019-03-24 DIAGNOSIS — D122 Benign neoplasm of ascending colon: Secondary | ICD-10-CM | POA: Diagnosis not present

## 2019-03-24 DIAGNOSIS — Z1211 Encounter for screening for malignant neoplasm of colon: Secondary | ICD-10-CM | POA: Diagnosis not present

## 2019-03-24 DIAGNOSIS — D124 Benign neoplasm of descending colon: Secondary | ICD-10-CM | POA: Diagnosis not present

## 2019-03-24 DIAGNOSIS — Z8371 Family history of colonic polyps: Secondary | ICD-10-CM | POA: Diagnosis not present

## 2019-03-24 DIAGNOSIS — Z800 Family history of malignant neoplasm of digestive organs: Secondary | ICD-10-CM | POA: Diagnosis not present

## 2019-03-24 DIAGNOSIS — D123 Benign neoplasm of transverse colon: Secondary | ICD-10-CM | POA: Diagnosis not present

## 2020-05-03 DIAGNOSIS — R5383 Other fatigue: Secondary | ICD-10-CM | POA: Diagnosis not present

## 2020-05-03 DIAGNOSIS — F411 Generalized anxiety disorder: Secondary | ICD-10-CM | POA: Diagnosis not present

## 2020-05-03 DIAGNOSIS — J302 Other seasonal allergic rhinitis: Secondary | ICD-10-CM | POA: Diagnosis not present

## 2020-05-03 DIAGNOSIS — E039 Hypothyroidism, unspecified: Secondary | ICD-10-CM | POA: Diagnosis not present

## 2020-07-13 DIAGNOSIS — Z01411 Encounter for gynecological examination (general) (routine) with abnormal findings: Secondary | ICD-10-CM | POA: Diagnosis not present

## 2020-07-13 DIAGNOSIS — Z124 Encounter for screening for malignant neoplasm of cervix: Secondary | ICD-10-CM | POA: Diagnosis not present

## 2020-07-13 DIAGNOSIS — Z01419 Encounter for gynecological examination (general) (routine) without abnormal findings: Secondary | ICD-10-CM | POA: Diagnosis not present

## 2020-07-13 DIAGNOSIS — Z113 Encounter for screening for infections with a predominantly sexual mode of transmission: Secondary | ICD-10-CM | POA: Diagnosis not present

## 2020-07-13 DIAGNOSIS — Z1231 Encounter for screening mammogram for malignant neoplasm of breast: Secondary | ICD-10-CM | POA: Diagnosis not present

## 2020-07-14 DIAGNOSIS — R350 Frequency of micturition: Secondary | ICD-10-CM | POA: Diagnosis not present

## 2020-09-20 DIAGNOSIS — R059 Cough, unspecified: Secondary | ICD-10-CM | POA: Diagnosis not present

## 2020-09-20 DIAGNOSIS — U071 COVID-19: Secondary | ICD-10-CM | POA: Diagnosis not present

## 2020-10-19 DIAGNOSIS — R5383 Other fatigue: Secondary | ICD-10-CM | POA: Diagnosis not present

## 2020-10-19 DIAGNOSIS — E039 Hypothyroidism, unspecified: Secondary | ICD-10-CM | POA: Diagnosis not present

## 2020-10-19 DIAGNOSIS — F411 Generalized anxiety disorder: Secondary | ICD-10-CM | POA: Diagnosis not present

## 2021-02-13 DIAGNOSIS — E039 Hypothyroidism, unspecified: Secondary | ICD-10-CM | POA: Diagnosis not present

## 2021-02-13 DIAGNOSIS — E782 Mixed hyperlipidemia: Secondary | ICD-10-CM | POA: Diagnosis not present

## 2021-02-13 DIAGNOSIS — E559 Vitamin D deficiency, unspecified: Secondary | ICD-10-CM | POA: Diagnosis not present

## 2021-05-09 DIAGNOSIS — R030 Elevated blood-pressure reading, without diagnosis of hypertension: Secondary | ICD-10-CM | POA: Diagnosis not present

## 2021-05-09 DIAGNOSIS — N3001 Acute cystitis with hematuria: Secondary | ICD-10-CM | POA: Diagnosis not present

## 2021-09-07 DIAGNOSIS — Z1231 Encounter for screening mammogram for malignant neoplasm of breast: Secondary | ICD-10-CM | POA: Diagnosis not present

## 2021-09-07 DIAGNOSIS — Z124 Encounter for screening for malignant neoplasm of cervix: Secondary | ICD-10-CM | POA: Diagnosis not present

## 2021-09-07 DIAGNOSIS — Z6824 Body mass index (BMI) 24.0-24.9, adult: Secondary | ICD-10-CM | POA: Diagnosis not present

## 2021-09-07 DIAGNOSIS — Z01419 Encounter for gynecological examination (general) (routine) without abnormal findings: Secondary | ICD-10-CM | POA: Diagnosis not present

## 2021-09-10 ENCOUNTER — Other Ambulatory Visit: Payer: Self-pay | Admitting: Obstetrics and Gynecology

## 2021-09-10 DIAGNOSIS — M8580 Other specified disorders of bone density and structure, unspecified site: Secondary | ICD-10-CM

## 2021-09-24 DIAGNOSIS — E039 Hypothyroidism, unspecified: Secondary | ICD-10-CM | POA: Diagnosis not present

## 2022-02-22 ENCOUNTER — Ambulatory Visit
Admission: RE | Admit: 2022-02-22 | Discharge: 2022-02-22 | Disposition: A | Discharge status: Home / Self Care | Payer: BC Managed Care – PPO | Source: Ambulatory Visit | Attending: Obstetrics and Gynecology | Admitting: Obstetrics and Gynecology

## 2022-02-22 DIAGNOSIS — M810 Age-related osteoporosis without current pathological fracture: Secondary | ICD-10-CM | POA: Diagnosis not present

## 2022-02-22 DIAGNOSIS — M85851 Other specified disorders of bone density and structure, right thigh: Secondary | ICD-10-CM | POA: Diagnosis not present

## 2022-02-22 DIAGNOSIS — M8580 Other specified disorders of bone density and structure, unspecified site: Secondary | ICD-10-CM

## 2022-02-22 DIAGNOSIS — Z780 Asymptomatic menopausal state: Secondary | ICD-10-CM | POA: Diagnosis not present

## 2022-03-14 DIAGNOSIS — M810 Age-related osteoporosis without current pathological fracture: Secondary | ICD-10-CM | POA: Diagnosis not present

## 2022-03-14 DIAGNOSIS — Z1329 Encounter for screening for other suspected endocrine disorder: Secondary | ICD-10-CM | POA: Diagnosis not present

## 2022-03-19 DIAGNOSIS — M810 Age-related osteoporosis without current pathological fracture: Secondary | ICD-10-CM | POA: Diagnosis not present

## 2022-03-25 DIAGNOSIS — E039 Hypothyroidism, unspecified: Secondary | ICD-10-CM | POA: Diagnosis not present

## 2022-09-23 DIAGNOSIS — Z1231 Encounter for screening mammogram for malignant neoplasm of breast: Secondary | ICD-10-CM | POA: Diagnosis not present

## 2022-09-23 DIAGNOSIS — Z01419 Encounter for gynecological examination (general) (routine) without abnormal findings: Secondary | ICD-10-CM | POA: Diagnosis not present

## 2023-03-12 DIAGNOSIS — Z13220 Encounter for screening for lipoid disorders: Secondary | ICD-10-CM | POA: Diagnosis not present

## 2023-03-12 DIAGNOSIS — E039 Hypothyroidism, unspecified: Secondary | ICD-10-CM | POA: Diagnosis not present

## 2023-03-12 DIAGNOSIS — Z131 Encounter for screening for diabetes mellitus: Secondary | ICD-10-CM | POA: Diagnosis not present

## 2023-03-12 DIAGNOSIS — Z1211 Encounter for screening for malignant neoplasm of colon: Secondary | ICD-10-CM | POA: Diagnosis not present

## 2023-03-24 DIAGNOSIS — Z1211 Encounter for screening for malignant neoplasm of colon: Secondary | ICD-10-CM | POA: Diagnosis not present

## 2023-05-13 DIAGNOSIS — E039 Hypothyroidism, unspecified: Secondary | ICD-10-CM | POA: Diagnosis not present

## 2023-05-14 ENCOUNTER — Other Ambulatory Visit (HOSPITAL_BASED_OUTPATIENT_CLINIC_OR_DEPARTMENT_OTHER): Payer: Self-pay | Admitting: Family Medicine

## 2023-05-14 DIAGNOSIS — E782 Mixed hyperlipidemia: Secondary | ICD-10-CM

## 2023-07-01 ENCOUNTER — Ambulatory Visit (INDEPENDENT_AMBULATORY_CARE_PROVIDER_SITE_OTHER): Payer: Self-pay

## 2023-07-01 DIAGNOSIS — E782 Mixed hyperlipidemia: Secondary | ICD-10-CM

## 2023-09-29 DIAGNOSIS — H6983 Other specified disorders of Eustachian tube, bilateral: Secondary | ICD-10-CM | POA: Diagnosis not present

## 2023-09-29 DIAGNOSIS — H6693 Otitis media, unspecified, bilateral: Secondary | ICD-10-CM | POA: Diagnosis not present

## 2023-09-29 DIAGNOSIS — H60393 Other infective otitis externa, bilateral: Secondary | ICD-10-CM | POA: Diagnosis not present

## 2023-12-24 DIAGNOSIS — E039 Hypothyroidism, unspecified: Secondary | ICD-10-CM | POA: Diagnosis not present

## 2023-12-24 DIAGNOSIS — Z6825 Body mass index (BMI) 25.0-25.9, adult: Secondary | ICD-10-CM | POA: Diagnosis not present
